# Patient Record
Sex: Female | Born: 1935 | Race: White | Hispanic: No
Health system: Southern US, Community
[De-identification: ages and names within clinical notes are randomized; demographics above are authoritative.]

## PROBLEM LIST (undated history)

## (undated) DIAGNOSIS — I10 Essential (primary) hypertension: Secondary | ICD-10-CM

---

## 2014-12-11 ENCOUNTER — Emergency Department: Payer: Medicare Other

## 2014-12-11 ENCOUNTER — Emergency Department
Admission: EM | Admit: 2014-12-11 | Discharge: 2014-12-11 | Payer: Medicare Other | Attending: Emergency Medicine | Admitting: Emergency Medicine

## 2014-12-11 ENCOUNTER — Encounter: Payer: Self-pay | Admitting: Emergency Medicine

## 2014-12-11 DIAGNOSIS — S065X9A Traumatic subdural hemorrhage with loss of consciousness of unspecified duration, initial encounter: Secondary | ICD-10-CM

## 2014-12-11 DIAGNOSIS — I10 Essential (primary) hypertension: Secondary | ICD-10-CM | POA: Insufficient documentation

## 2014-12-11 DIAGNOSIS — S065X0A Traumatic subdural hemorrhage without loss of consciousness, initial encounter: Secondary | ICD-10-CM | POA: Insufficient documentation

## 2014-12-11 DIAGNOSIS — Z7901 Long term (current) use of anticoagulants: Secondary | ICD-10-CM | POA: Diagnosis not present

## 2014-12-11 DIAGNOSIS — S01111A Laceration without foreign body of right eyelid and periocular area, initial encounter: Secondary | ICD-10-CM | POA: Diagnosis present

## 2014-12-11 DIAGNOSIS — Y9389 Activity, other specified: Secondary | ICD-10-CM | POA: Insufficient documentation

## 2014-12-11 DIAGNOSIS — Z792 Long term (current) use of antibiotics: Secondary | ICD-10-CM | POA: Diagnosis not present

## 2014-12-11 DIAGNOSIS — Y9289 Other specified places as the place of occurrence of the external cause: Secondary | ICD-10-CM | POA: Diagnosis not present

## 2014-12-11 DIAGNOSIS — Z79899 Other long term (current) drug therapy: Secondary | ICD-10-CM | POA: Insufficient documentation

## 2014-12-11 DIAGNOSIS — Y998 Other external cause status: Secondary | ICD-10-CM | POA: Diagnosis not present

## 2014-12-11 DIAGNOSIS — W109XXA Fall (on) (from) unspecified stairs and steps, initial encounter: Secondary | ICD-10-CM | POA: Diagnosis not present

## 2014-12-11 DIAGNOSIS — S065XAA Traumatic subdural hemorrhage with loss of consciousness status unknown, initial encounter: Secondary | ICD-10-CM

## 2014-12-11 HISTORY — DX: Essential (primary) hypertension: I10

## 2014-12-11 LAB — BASIC METABOLIC PANEL
ANION GAP: 8 (ref 5–15)
BUN: 29 mg/dL — ABNORMAL HIGH (ref 6–20)
CHLORIDE: 109 mmol/L (ref 101–111)
CO2: 21 mmol/L — AB (ref 22–32)
Calcium: 8.6 mg/dL — ABNORMAL LOW (ref 8.9–10.3)
Creatinine, Ser: 1.28 mg/dL — ABNORMAL HIGH (ref 0.44–1.00)
GFR calc Af Amer: 43 mL/min — ABNORMAL LOW (ref >60)
GFR calc non Af Amer: 37 mL/min — ABNORMAL LOW (ref >60)
GLUCOSE: 173 mg/dL — AB (ref 65–99)
POTASSIUM: 3.6 mmol/L (ref 3.5–5.1)
Sodium: 138 mmol/L (ref 135–145)

## 2014-12-11 LAB — CBC WITH DIFFERENTIAL/PLATELET
Basophils Absolute: 0.1 10*3/uL (ref 0–0.1)
Basophils Relative: 1 %
Eosinophils Absolute: 0.3 10*3/uL (ref 0–0.7)
Eosinophils Relative: 3 %
HCT: 36.9 % (ref 35.0–47.0)
HEMOGLOBIN: 12 g/dL (ref 12.0–16.0)
LYMPHS ABS: 1.7 10*3/uL (ref 1.0–3.6)
LYMPHS PCT: 17 %
MCH: 28.9 pg (ref 26.0–34.0)
MCHC: 32.4 g/dL (ref 32.0–36.0)
MCV: 89.3 fL (ref 80.0–100.0)
Monocytes Absolute: 1.2 10*3/uL — ABNORMAL HIGH (ref 0.2–0.9)
Monocytes Relative: 12 %
NEUTROS ABS: 6.6 10*3/uL — AB (ref 1.4–6.5)
NEUTROS PCT: 67 %
Platelets: 256 10*3/uL (ref 150–440)
RBC: 4.14 MIL/uL (ref 3.80–5.20)
RDW: 15 % — ABNORMAL HIGH (ref 11.5–14.5)
WBC: 9.9 10*3/uL (ref 3.6–11.0)

## 2014-12-11 LAB — BLOOD GAS, ARTERIAL
ACID-BASE DEFICIT: 1.5 mmol/L (ref 0.0–2.0)
BICARBONATE: 19.9 meq/L — AB (ref 21.0–28.0)
FIO2: 0.8
Mechanical Rate: 20
O2 SAT: 99.8 %
PEEP/CPAP: 5 cmH2O
PO2 ART: 198 mmHg — AB (ref 83.0–108.0)
Patient temperature: 37
RATE: 20 resp/min
VT: 450 mL
pCO2 arterial: 25 mmHg — ABNORMAL LOW (ref 32.0–48.0)
pH, Arterial: 7.51 — ABNORMAL HIGH (ref 7.350–7.450)

## 2014-12-11 LAB — URINALYSIS COMPLETE WITH MICROSCOPIC (ARMC ONLY)
Bacteria, UA: NONE SEEN
Bilirubin Urine: NEGATIVE
Glucose, UA: 150 mg/dL — AB
KETONES UR: NEGATIVE mg/dL
LEUKOCYTES UA: NEGATIVE
Nitrite: NEGATIVE
PH: 7 (ref 5.0–8.0)
PROTEIN: 30 mg/dL — AB
SPECIFIC GRAVITY, URINE: 1.006 (ref 1.005–1.030)

## 2014-12-11 LAB — PROTIME-INR
INR: 2.18
Prothrombin Time: 24.4 seconds — ABNORMAL HIGH (ref 11.4–15.0)

## 2014-12-11 LAB — APTT: aPTT: 30 seconds (ref 24–36)

## 2014-12-11 MED ORDER — MANNITOL 25 % IV SOLN
25.0000 g | Freq: Once | INTRAVENOUS | Status: AC
  Administered 2014-12-11: 25 g via INTRAVENOUS
  Filled 2014-12-11: qty 100

## 2014-12-11 MED ORDER — VITAMIN K1 10 MG/ML IJ SOLN: 10.0000 mg | Freq: Once | INTRAVENOUS | Status: DC

## 2014-12-11 MED ORDER — LORAZEPAM 2 MG/ML IJ SOLN
INTRAMUSCULAR | Status: AC
  Filled 2014-12-11: qty 1

## 2014-12-11 MED ORDER — LABETALOL HCL 5 MG/ML IV SOLN
10.0000 mg | Freq: Once | INTRAVENOUS | Status: AC
  Administered 2014-12-11: 10 mg via INTRAVENOUS
  Filled 2014-12-11: qty 4

## 2014-12-11 MED ORDER — ROCURONIUM BROMIDE 50 MG/5ML IV SOLN
60.0000 mg | Freq: Once | INTRAVENOUS | Status: AC
  Administered 2014-12-11: 60 mg via INTRAVENOUS

## 2014-12-11 MED ORDER — FENTANYL CITRATE (PF) 100 MCG/2ML IJ SOLN
50.0000 ug | Freq: Once | INTRAMUSCULAR | Status: AC
  Administered 2014-12-11: 50 ug via INTRAVENOUS
  Filled 2014-12-11: qty 2

## 2014-12-11 MED ORDER — FENTANYL CITRATE (PF) 100 MCG/2ML IJ SOLN
50.0000 ug | Freq: Once | INTRAMUSCULAR | Status: AC
  Administered 2014-12-11: 50 ug via INTRAVENOUS

## 2014-12-11 MED ORDER — TETANUS-DIPHTHERIA TOXOIDS TD 5-2 LFU IM INJ: 0.5000 mL | INJECTION | Freq: Once | INTRAMUSCULAR | Status: DC

## 2014-12-11 MED ORDER — ETOMIDATE 2 MG/ML IV SOLN
20.0000 mg | Freq: Once | INTRAVENOUS | Status: AC
  Administered 2014-12-11: 20 mg via INTRAVENOUS

## 2014-12-11 MED ORDER — ONDANSETRON HCL 4 MG/2ML IJ SOLN
INTRAMUSCULAR | Status: AC
  Administered 2014-12-11: 4 mg
  Filled 2014-12-11: qty 2

## 2014-12-11 MED ORDER — LIDOCAINE-EPINEPHRINE (PF) 1 %-1:200000 IJ SOLN: 30.0000 mL | Freq: Once | INTRAMUSCULAR | Status: DC

## 2014-12-11 MED ORDER — VITAMIN K1 10 MG/ML IJ SOLN
10.0000 mg | INTRAVENOUS | Status: AC
  Administered 2014-12-11: 10 mg via INTRAVENOUS
  Filled 2014-12-11 (×2): qty 1

## 2014-12-11 MED ORDER — EMPTY CONTAINERS FLEXIBLE MISC
1500.0000 [IU] | Status: AC
  Administered 2014-12-11: 1500 [IU] via INTRAVENOUS
  Filled 2014-12-11: qty 20

## 2014-12-11 MED ORDER — PROPOFOL 1000 MG/100ML IV EMUL
INTRAVENOUS | Status: AC
  Administered 2014-12-11 (×3)
  Administered 2014-12-11: 1000 mg
  Filled 2014-12-11: qty 100

## 2014-12-11 NOTE — ED Provider Notes (Signed)
[facility] Emergency Department Provider Note  ____________________________________________  Time seen: 8:40 AM on arrival by EMS  I have reviewed the triage vital signs and the nursing notes.   HISTORY  Chief Complaint Fall and Facial Laceration    HPI Stacey Thompson is a 81 y.o. female who is just returning home from eating breakfast at [employer] when she chipped going up the steps and fell backward. She hit the right side of her head on the ground and lost consciousness briefly according to a friend of hers was present at the time and witnessed the fall. The patient denies any preceding chest pain headache shortness of breath belly pain back pain numbness tingling weakness. She does currently complain of diffuse headache that is severe. She also complains of right shoulder pain. She does report she has a history of rotator cuff tear on the right side.  The patient does take Coumadin which was recently checked about one week ago. Her INR was 3.5 at the time according to the patient and she skipped one dose of her Coumadin.   Past Medical History  Diagnosis Date  . Hypertension    metoprolol (TOPROL-XL) 100 MG XL tablet     07/16/2009  Active  potassium chloride (K-DUR,KLOR-CON) 20 MEQ CR tablet     05/26/2009  Active  zolpidem (AMBIEN) 10 mg tablet     08/04/2009  Active  albuterol 90 mcg/actuation inhaler  Inhale 2 inhalations into the lungs.     Active  atorvastatin (LIPITOR) 10 MG tablet  Take 0.5 tablets by mouth.   06/22/2013  Active  omega-3 fatty acids/fish oil 340-1,000 mg capsule  Take 1 capsule by mouth.     Active  fluticasone (FLONASE) 50 mcg/actuation nasal spray  1 spray by Nasal route.     Active  hydrALAZINE (APRESOLINE) 50 MG tablet  Take 1 tablet by mouth.   06/24/2013  Active  nitroGLYcerin (NITROSTAT) 0.4 MG SL tablet  Place 0.4 mg under the tongue.     Active  warfarin (COUMADIN) 3 MG  tablet  Take 3 mg by mouth.     Active  valsartan-hydrochlorothiazide (DIOVAN-HCT) 320-25 mg tablet     02/17/2014  Active  Active Problems Problem Noted Date  Essential hypertension 07/21/2013  Hyposmolality and/or hyponatremia 07/21/2013  Urinary frequency 07/21/2013  Most Recent Encounters Date Type Specialty Providers Description  05/05/2014 Initial consult Podiatry Troxler, Sofie Rower, DPM  Acute foot pain, left (Primary Dx);Sprain of foot, left, initial encounter  08/12/2010 Historical Unknown Encounter  Arvilla Meres, MD    08/11/2009 Historical Outpatient Cardiovascular Medicine Antoine Primas, MD    Surgical History Surgery Date Comments  Appendectomy    Hysterectomy    Insert / Replace / Remove Pacemaker    MITROVALVE REPLACEMENT    Medical History Medical History Date Comments  Hypertension    Heart disease    Family History Medical History Relation Name Comments  Heart disease Mother    Stroke Mother    Thyroid disease Mother    Heart disease Father    Colon cancer Brother ##Brother1   Prostate cancer Brother ##Brother1   Diabetes type II Brother ##Brother1   Social History Tobacco Use Types Packs/Day Years Used Date  Never Smoker      Smokeless Tobacco: Never Used      Alcohol Use Drinks/Week oz/Week Comments  No        There are no active problems to display for this patient.    No  past surgical history on file. Cardiac surgery  No current outpatient prescriptions on file. As above  Allergies Review of patient's allergies indicates not on file.   No family history on file.  Social History Social History  Substance Use Topics  . Smoking status: Not on file  . Smokeless tobacco: Not on file  . Alcohol Use: Not on file    Review of Systems  Constitutional:   No fever or chills. No weight changes Eyes:   No blurry vision or double vision.  ENT:   No sore throat. Cardiovascular:   No  chest pain. Respiratory:   No dyspnea or cough. Gastrointestinal:   Negative for abdominal pain, vomiting and diarrhea.  No BRBPR or melena. Genitourinary:   Negative for dysuria, urinary retention, bloody urine, or difficulty urinating. Musculoskeletal:   Negative for back pain. Right shoulder pain. Skin:   Negative for rash. Neurological:   Positive for headaches, without focal weakness or numbness. Psychiatric:  No anxiety or depression.   Endocrine:  No hot/cold intolerance, changes in energy, or sleep difficulty.  10-point ROS otherwise negative.  ____________________________________________   PHYSICAL EXAM:  VITAL SIGNS: ED Triage Vitals  Enc Vitals Group     BP 12/11/14 0854 190/82 mmHg     Pulse Rate 12/11/14 0854 78     Resp --      Temp 12/11/14 0854 98.2 F (36.8 C)     Temp Source 12/11/14 0854 Oral     SpO2 12/11/14 0854 99 %     Weight 12/11/14 0854 140 lb (63.504 kg)     Height 12/11/14 0854 5\' 4"  (1.626 m)     Head Cir --      Peak Flow --      Pain Score 12/11/14 0855 9     Pain Loc --      Pain Edu? --      Excl. in GC? --      Constitutional:   Alert and oriented. Well appearing and in no distress. Ambulatory at scene, ambulatory in the exam room to the bathroom on the patient's own insistence. Eyes:   No scleral icterus. No conjunctival pallor. PERRL. EOMI, no nystagmus ENT   Head:   Normocephalic with a 2cm laceration over the lateral aspect of the right eyebrow. There is tenderness in the area. It is hemostatic.Marland Kitchen.   Nose:   No congestion/rhinnorhea. No septal hematoma   Mouth/Throat:   MMM, no pharyngeal erythema. No peritonsillar mass. No uvula shift.   Neck:   No stridor. No SubQ emphysema. No meningismus. No midline spinal tenderness. Full range of motion Hematological/Lymphatic/Immunilogical:   No cervical lymphadenopathy. Cardiovascular:   RRR. Normal and symmetric distal pulses are present in all extremities. No murmurs, rubs, or  gallops. Respiratory:   Normal respiratory effort without tachypnea nor retractions. Breath sounds are clear and equal bilaterally. No wheezes/rales/rhonchi. Gastrointestinal:   Soft and nontender. No distention. There is no CVA tenderness.  No rebound, rigidity, or guarding. Genitourinary:   deferred Musculoskeletal:   Normal range of motion in all extremities. There is tenderness on the right shoulder at the acromion without deformity crepitus or step-off.  Neurologic:   Normal speech and language.  CN 2-10 normal. Motor grossly intact. No pronator drift.  Normal gait. No gross focal neurologic deficits are appreciated.  Skin:    Skin is warm, dry and intact. No rash noted.  No petechiae, purpura, or bullae. Psychiatric:   Mood and affect are normal. Speech and  behavior are normal. Patient exhibits appropriate insight and judgment.  ____________________________________________    LABS (pertinent positives/negatives) (all labs ordered are listed, but only abnormal results are displayed) Labs Reviewed  BASIC METABOLIC PANEL - Abnormal; Notable for the following:    CO2 21 (*)    Glucose, Bld 173 (*)    BUN 29 (*)    Creatinine, Ser 1.28 (*)    Calcium 8.6 (*)    GFR calc non Af Amer 37 (*)    GFR calc Af Amer 43 (*)    All other components within normal limits  PROTIME-INR - Abnormal; Notable for the following:    Prothrombin Time 24.4 (*)    All other components within normal limits  CBC WITH DIFFERENTIAL/PLATELET - Abnormal; Notable for the following:    RDW 15.0 (*)    Neutro Abs 6.6 (*)    Monocytes Absolute 1.2 (*)    All other components within normal limits  APTT  TYPE AND SCREEN   ____________________________________________   EKG    ____________________________________________    RADIOLOGY  CT head reveals a large left subdural hematoma with midline shift and effacement of the basal cisterns suggestive of impending herniation Chest x-ray unremarkable,  good endotracheal tube placement Results reviewed by me, films daily by me, results discussed with radiologist  ____________________________________________   PROCEDURES INTUBATION Performed by: Scotty Court, Mylin Hirano  Required items: required blood products, implants, devices, and special equipment available Patient identity confirmed: provided demographic data and hospital-assigned identification number Time out: Immediately prior to procedure a "time out" was called to verify the correct patient, procedure, equipment, support staff and site/side marked as required.  Indications: Airway protection   Intubation method: Video Glidescope Laryngoscopy   Preoxygenation: 100% FiO2 BVM  Sedatives:  Etomidate Paralytic: 60 mg rocuronium   Tube Size: 7.5 cuffed  Post-procedure assessment: chest rise and ETCO2 monitor Breath sounds: equal and absent over the epigastrium. Fogging the tube Tube secured with: ETT holder Chest x-ray interpreted by radiologist and me.  Chest x-ray findings: endotracheal tube in appropriate position  Patient tolerated the procedure well with no immediate complications. oxygen saturation 100% prior to paralysis, lowest O2 sat was 100%, O2 sat remained 100% after intubation    CRITICAL CARE Performed by: Scotty Court, Jaslyn Bansal   Total critical care time: 45 minutes  Critical care time was exclusive of separately billable procedures and treating other patients.  Critical care was necessary to treat or prevent imminent or life-threatening deterioration.  Critical care was time spent personally by me on the following activities: development of treatment plan with patient and/or surrogate as well as nursing, discussions with consultants, evaluation of patient's response to treatment, examination of patient, obtaining history from patient or surrogate, ordering and performing treatments and interventions, ordering and review of laboratory studies, ordering and  review of radiographic studies, pulse oximetry and re-evaluation of patient's condition.    ____________________________________________   INITIAL IMPRESSION / ASSESSMENT AND PLAN / ED COURSE  Pertinent labs & imaging results that were available during my care of the patient were reviewed by me and considered in my medical decision making (see chart for details).  Patient presents with right forehead trauma after a fall off of one or 2 steps. She did have loss of consciousness is on Coumadin. We will check a CT head and her INR as well as an x-ray of the right shoulder. Normal mental status at present time  ----------------------------------------- 10:19 AM on 12/11/2014 -----------------------------------------  Called back to the room  at 9:05 AM by the nurse due to vomiting. On returning to the patient's bedside she was unresponsive and covered with emesis. The jaw was clenched and she was biting her tongue. Pupils were unreactive but symmetric at 3 mm bilaterally, although the gaze was disconjugate with the right eyelid looking slightly outward. I remained at the patient's bedside monitoring her condition, initially awaiting some IV Ativan or what appeared to be a seizure likely due to a head bleed. However after a few minutes, around 9:15 AM, her jaw loosened and the seizures seem to resolve spontaneously. I reassessed her pupils and now found that the right pupil was fixed and dilated at 6 mm while the left pupil remained unreactive at 2-3 mm.  The patient was intubated at 9:18 AM. Procedure note as above.  I discussed the patient's care with Dr. Emmaline Kluver of [facility] neurosurgery at 9:52 AM who accepted the patient to the [facility] neurosurgery ICU pending air transport. Patient was ordered vitamin K and pro thrombin complex concentrate for reversal of her INR, propofol drip, internal bolus for pain, labetalol IV for hypertension, hyperventilation and mannitol for elevated intracranial pressure and  attempted salvage therapy of the patient's severe injury.  Was informed that 10:10 AM that [facility] has reported that they have no immediately available beds. They're working with her house supervisor to free up a bed. We will wait a brief amount of time, but due to the patient's perimortem critical illness, if they are unable to provide a bed and transfer soon, we will pursue other destinations. ----------------------------------------- 12:20 PM on 12/11/2014 -----------------------------------------  Continued managing patient with intermittent boluses of the fall and IV fentanyl. With repeated check ends, [facility] transfer [facility] continued indicating that they expected to have a ICU bed ready any time. We eventually planned for transfer to the [facility] ED while waiting for the bed. Helicopter air transport has arrived. Blood pressure about 160/90, patient sedated, oxygenation good. Warm blankets for mild hypothermia 94.   ____________________________________________   FINAL CLINICAL IMPRESSION(S) / ED DIAGNOSES  Final diagnoses:  Subdural hematoma (HCC)      Sharman Cheek, MD 12/11/14 1221

## 2014-12-11 NOTE — ED Notes (Signed)
EMS reported upon their arrival that pt fell but did not suffer LOC (per the pt). When her friend arrived, who had been at the scene when the fall occurred, stated that patient did suffer LOC and was out for "a few minutes."

## 2014-12-11 NOTE — ED Notes (Signed)
Pt brought in via EMS; pt fell at home going up steps on outside porch. Pt A/O x4, no c-collar on arrival, laceration above right eye, complaints of pain in head and right shoulder.

## 2014-12-11 NOTE — ED Notes (Addendum)
When pt arrived at ED approximately 0845, she was talking to MD and nurses, and walked to the restroom. At approximately 0855, she began to vomit. Two nurses were in the room with the patient and assisted her. When she finished vomiting, she was unresponsive. Her eyes were open and gazing out in the distance, but she did not respond to questions or a strong sternal rub. Her breathing was of the snoring type. The MD was notified and came in the room. He ordered zofran and 2 mg of ativan. When this nurse arrived back into the room, the MD stated that it appeared she had experienced some kind of seizure-like activity but had since become less rigid and may not need the ativan. Shortly thereafter, the decision was made to intubate the patient. Preparations were made, and the patient was intubated at 0918. After the chest x-ray confirming tube placement was complete at the bedside, this nurse accompanied the pt for head CT.   When results were given to Dr. Scotty CourtStafford, arrangements were made [facility]. During the time pt was awaiting, medications were given, including mannitol, Vitamin K, and labetalol, and propofol. Pt did move around at times and was given boluses of propofol per Dr. Scotty CourtStafford. [facility] air transport arrived approximately 1235 and left with patient approximately 1300 hours.

## 2014-12-11 NOTE — Progress Notes (Signed)
MEDICATION RELATED CONSULT NOTE - INITIAL   [facility]  Indication: Subdural hematoma on warfarin  Allergies not on file  Patient Measurements: Height: 5\' 4"  (162.6 cm) Weight: 140 lb (63.504 kg) IBW/kg (Calculated) : 54.7   Vital Signs: Temp: 98.2 F (36.8 C) (10/20 0854) Temp Source: Oral (10/20 0854) BP: 190/82 mmHg (10/20 0854) Pulse Rate: 78 (10/20 0854) Intake/Output from previous day:   Intake/Output from this shift:    Labs:  Recent Labs  12/11/14 0915  WBC 9.9  HGB 12.0  HCT 36.9  PLT 256  APTT 30  CREATININE 1.28*   Estimated Creatinine Clearance: 27.7 mL/min (by C-G formula based on Cr of 1.28).   Microbiology: No results found for this or any previous visit (from the past 720 hour(s)).  Medical History: Past Medical History  Diagnosis Date  . Hypertension     Medications:   (Not in a hospital admission)  Assessment: 81 y/o F with subdural hematoma s/p fall on Coumadin.   INR: 2.1  Plan:  [facility] 1500 units ordered x 1. Dose is appropriate. Will order and f/u serial INRs.   Luisa Harthristy, Jarian Longoria D 12/11/2014,11:31 AM

## 2014-12-23 DEATH — deceased

## 2016-09-21 IMAGING — CT CT HEAD W/O CM
3 series · 16 of 30 positions shown, 17 images · non-contrast
Comparison: None.

CLINICAL DATA: Fall. Right sided laceration above eye. On Coumadin.

EXAM:
CT HEAD WITHOUT CONTRAST
TECHNIQUE: Contiguous axial images were obtained from the base of the skull
through the vertex without intravenous contrast.

[Series 2: soft tissue · axial · 0.41mm/px · z∈[+352,+442]mm · 4 of 31 slices shown, 5 images]
[im 7/31  brain]
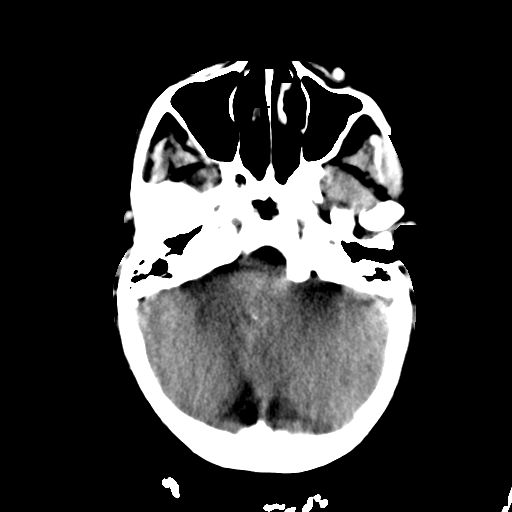
[im 7/31  bone]
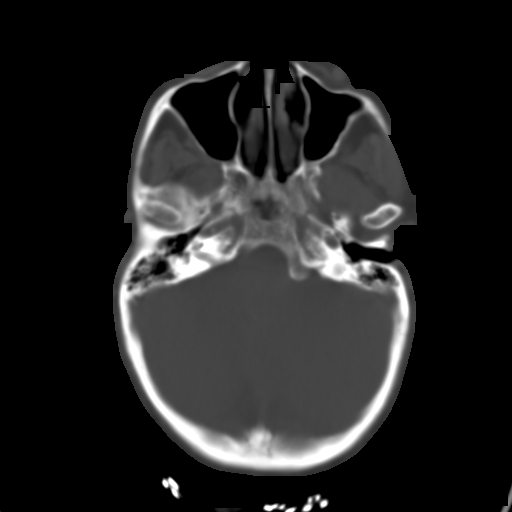
[im 13/31  brain]
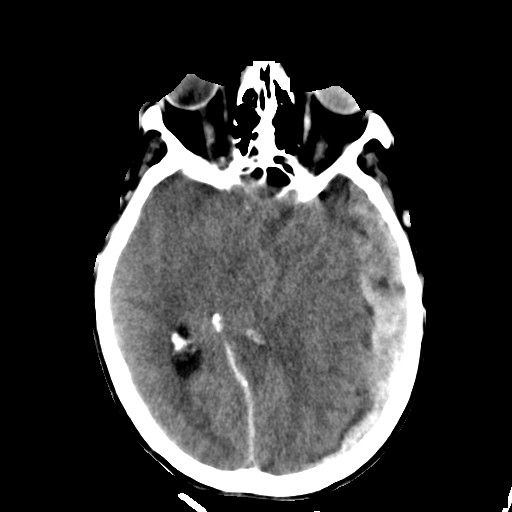
[im 19/31  brain]
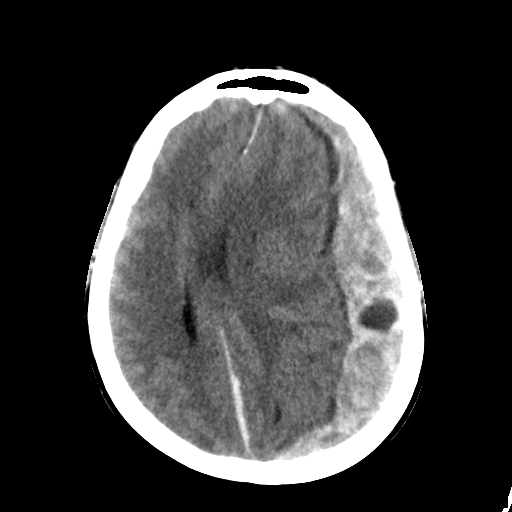
[im 25/31  brain]
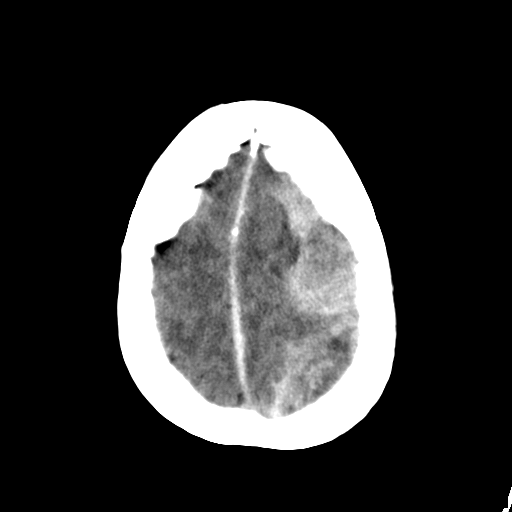

[Series 3: bone · axial · 0.41mm/px · z∈[+332,+472]mm · 8 of 87 slices shown]
[im 11/87  bone]
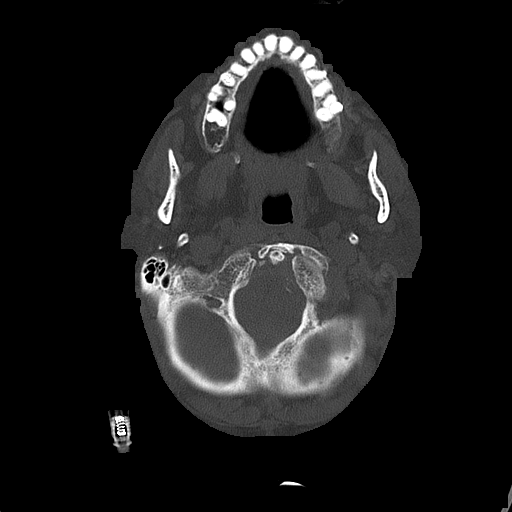
[im 21/87  bone]
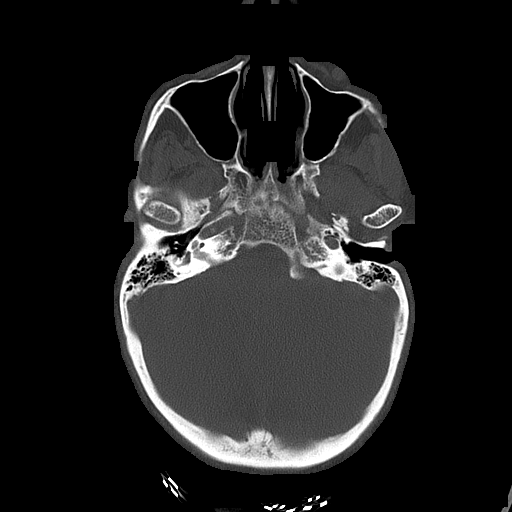
[im 31/87  bone]
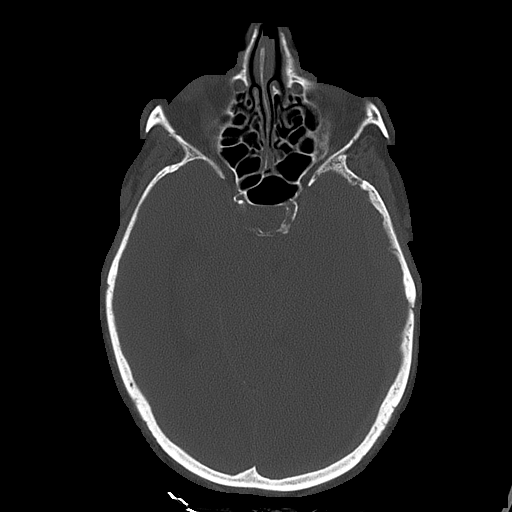
[im 41/87  bone]
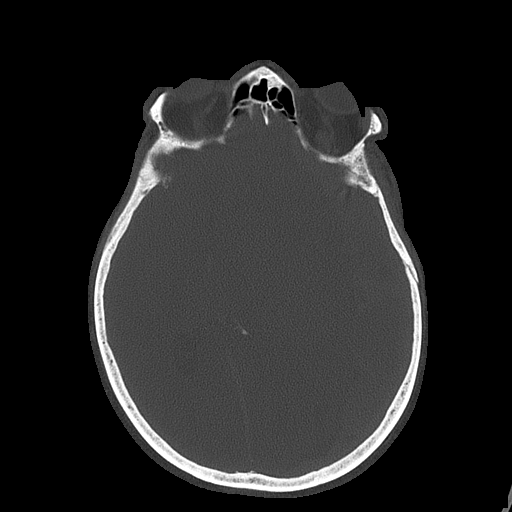
[im 51/87  bone]
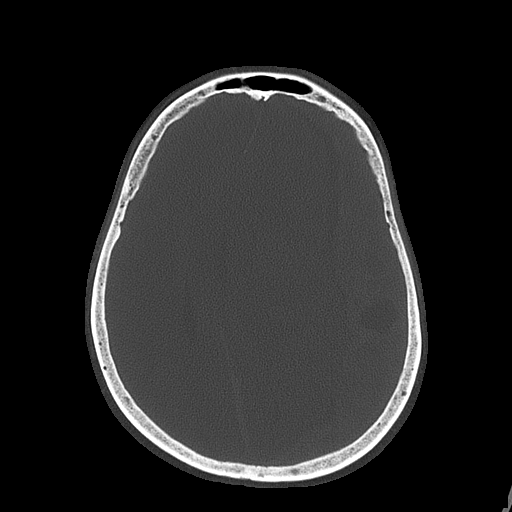
[im 61/87  bone]
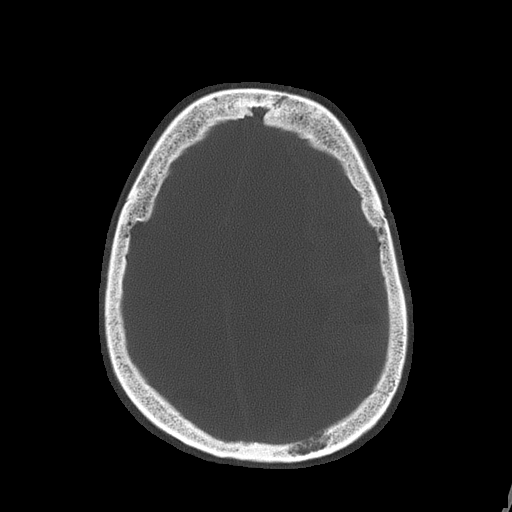
[im 71/87  bone]
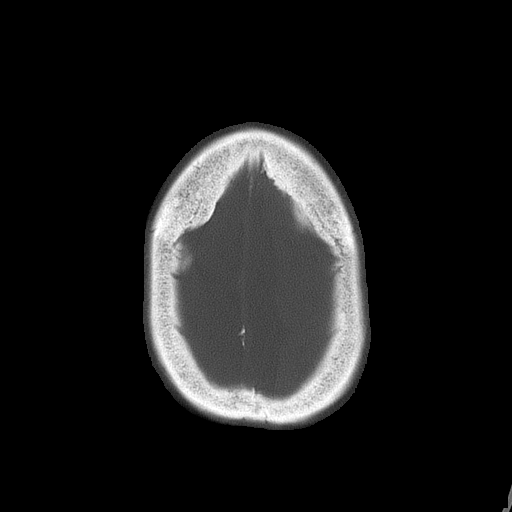
[im 81/87  bone]
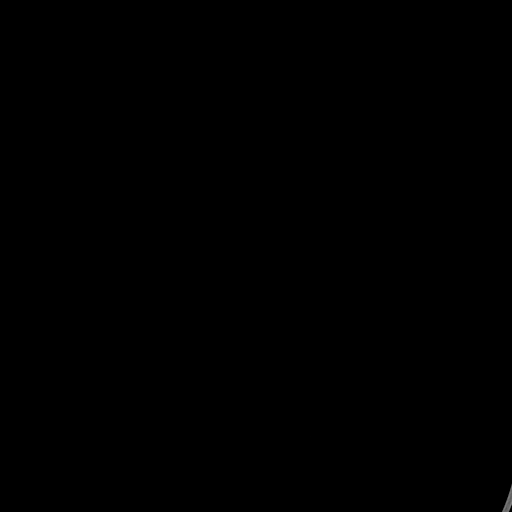

[Series 4: soft tissue recon · axial · 0.42mm/px · z∈[+382,+467]mm · 4 of 31 slices shown]
[im 7/31  brain]
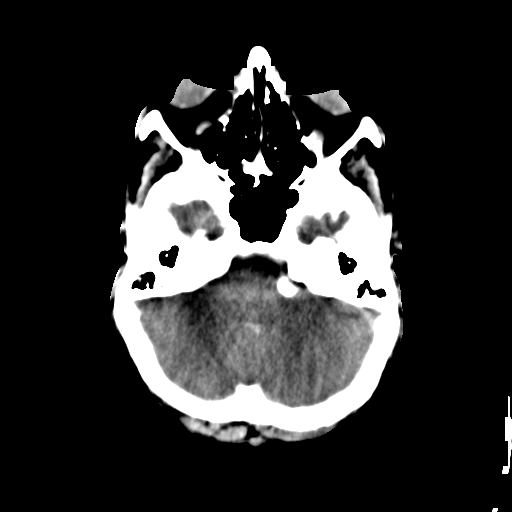
[im 13/31  brain]
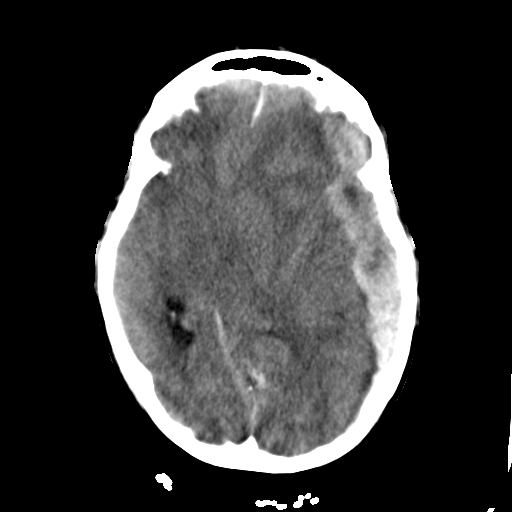
[im 19/31  brain]
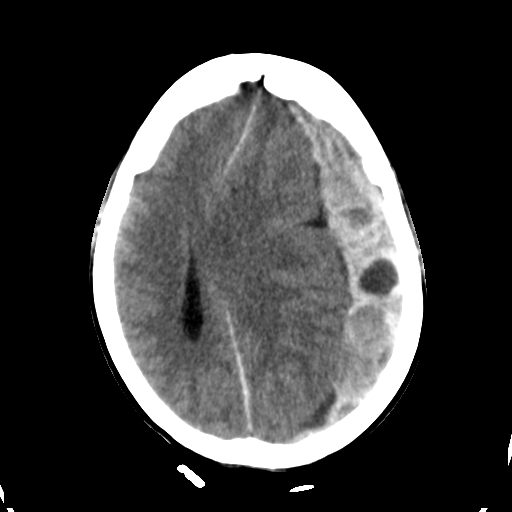
[im 25/31  brain]
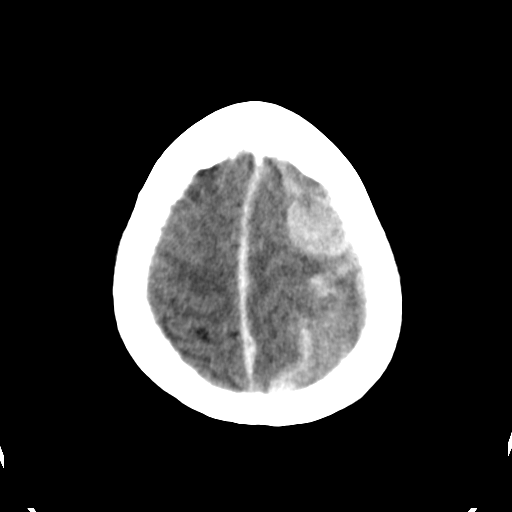

[16 of 30 positions shown; findings below may reference images not displayed]

FINDINGS: Sinuses/Soft tissues: Endotracheal tube. Soft tissue swelling
lateral and superior to the right orbit. Clear paranasal sinuses and
mastoid air cells.

Intracranial: A large mixed attenuation left-sided subdural fluid
collection/hematoma. Measures maximally 2.4 cm. This extends from
the superior aspect of the left middle cranial fossa to the vertex.
Significant mass effect with 26 mm of left to right midline shift on
image/series [DATE]. Effacement of the left lateral ventricle with
prominence of the right temporal horn, suggesting developing
hydrocephalus. The basal cisterns are effaced, likely secondary to
mass effect. There is a small amount of subdural blood along the
interhemispheric fissure.

No complicating ischemia identified.
IMPRESSION: 1. Large subdural hematoma, adjacent the left cerebral hemisphere.
Significant mass effect with effacement of left lateral ventricle
and basal cisterns. Right temporal horn prominence suggests
developing complicating hydrocephalus.
2. Small volume subdural hemorrhage along the interhemispheric
fissure.
3. Right-sided facial soft tissue swelling.
Critical test results telephoned to.Dr. York.. at the time of
